# Patient Record
Sex: Male | Born: 1990 | Race: Black or African American | Hispanic: No | Marital: Single | Smoking: Current every day smoker
Health system: Southern US, Community
[De-identification: ages and names within clinical notes are randomized; demographics above are authoritative.]

---
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
## 2010-01-16 IMAGING — CR DG ELBOW 2V*L*
1 series · 2 of 2 positions shown · non-contrast
Comparison: none

REASON FOR EXAM: frall with pain
COMMENTS:

PROCEDURE:     DXR - DXR ELBOW LEFT AP AND LATERAL  - September 30, 2007  [DATE]
RESULT:     There is no evidence of fracture, dislocation or malalignment.
No evidence of anterior or posterior fat pad sign is appreciated.

[Series 1: view not recorded · 0.17mm/px · 2 of 2 slices shown]
[im 1/2]
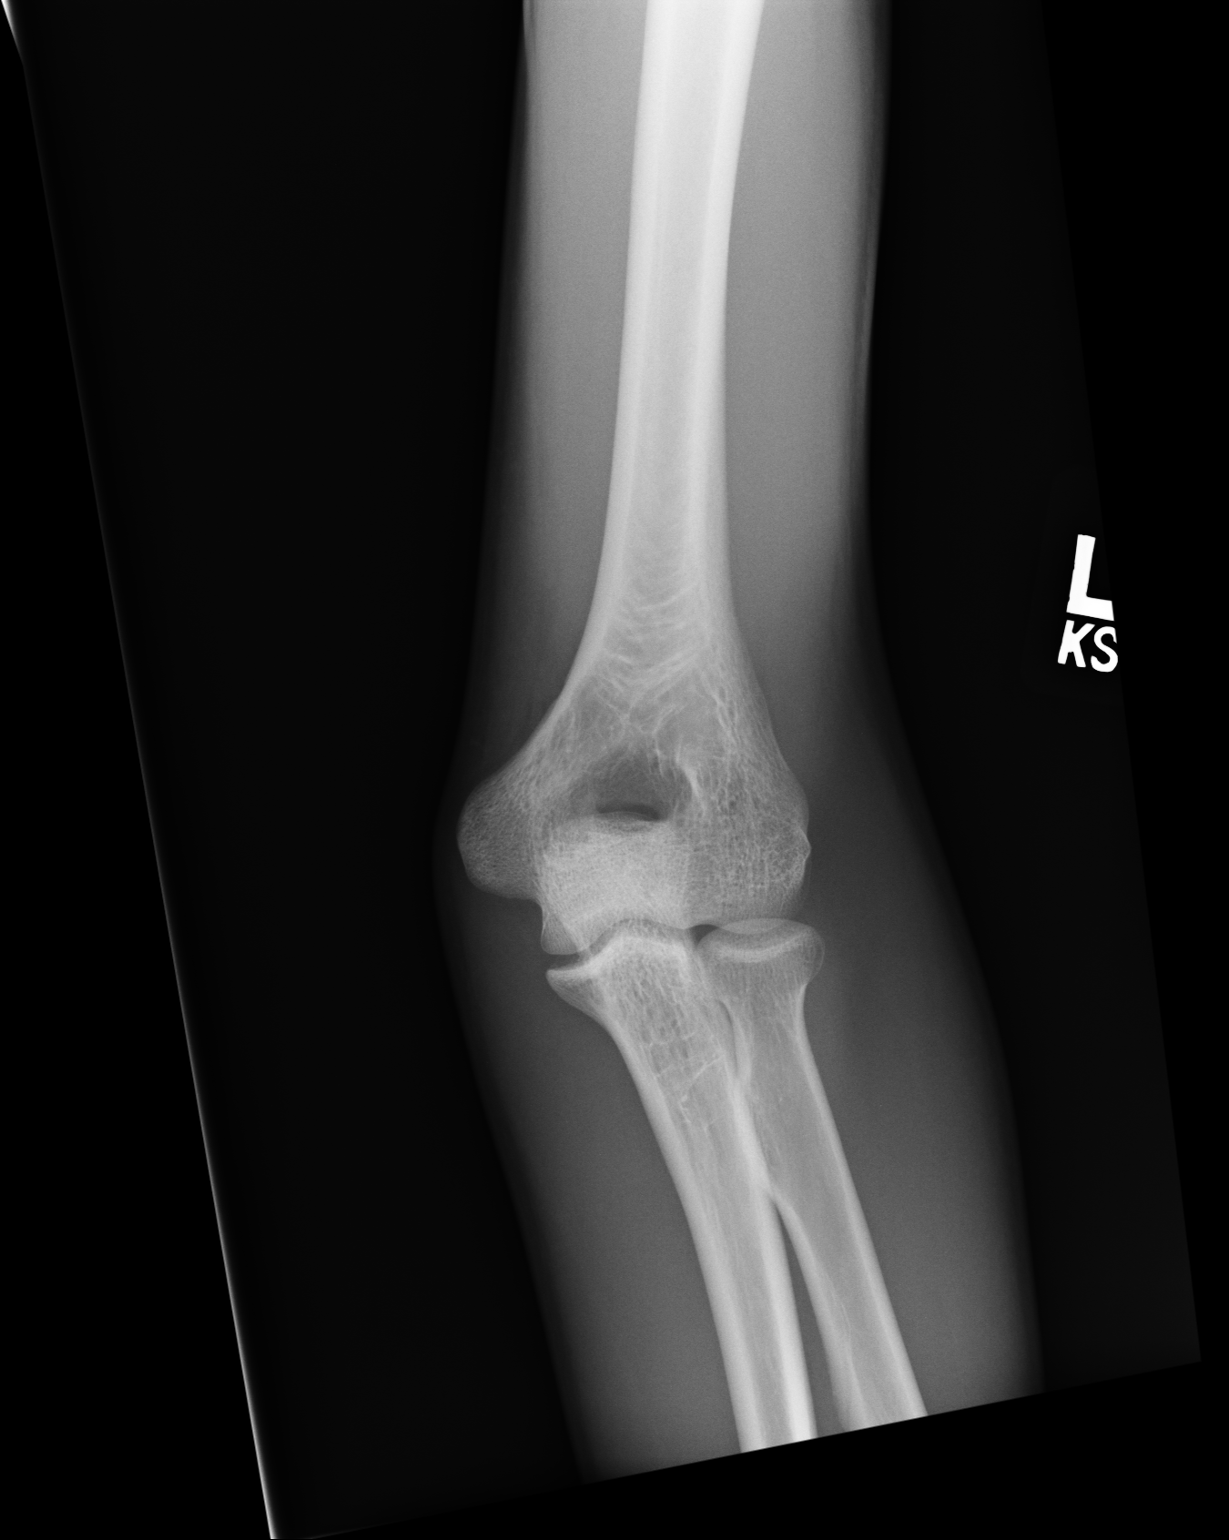
[im 2/2]
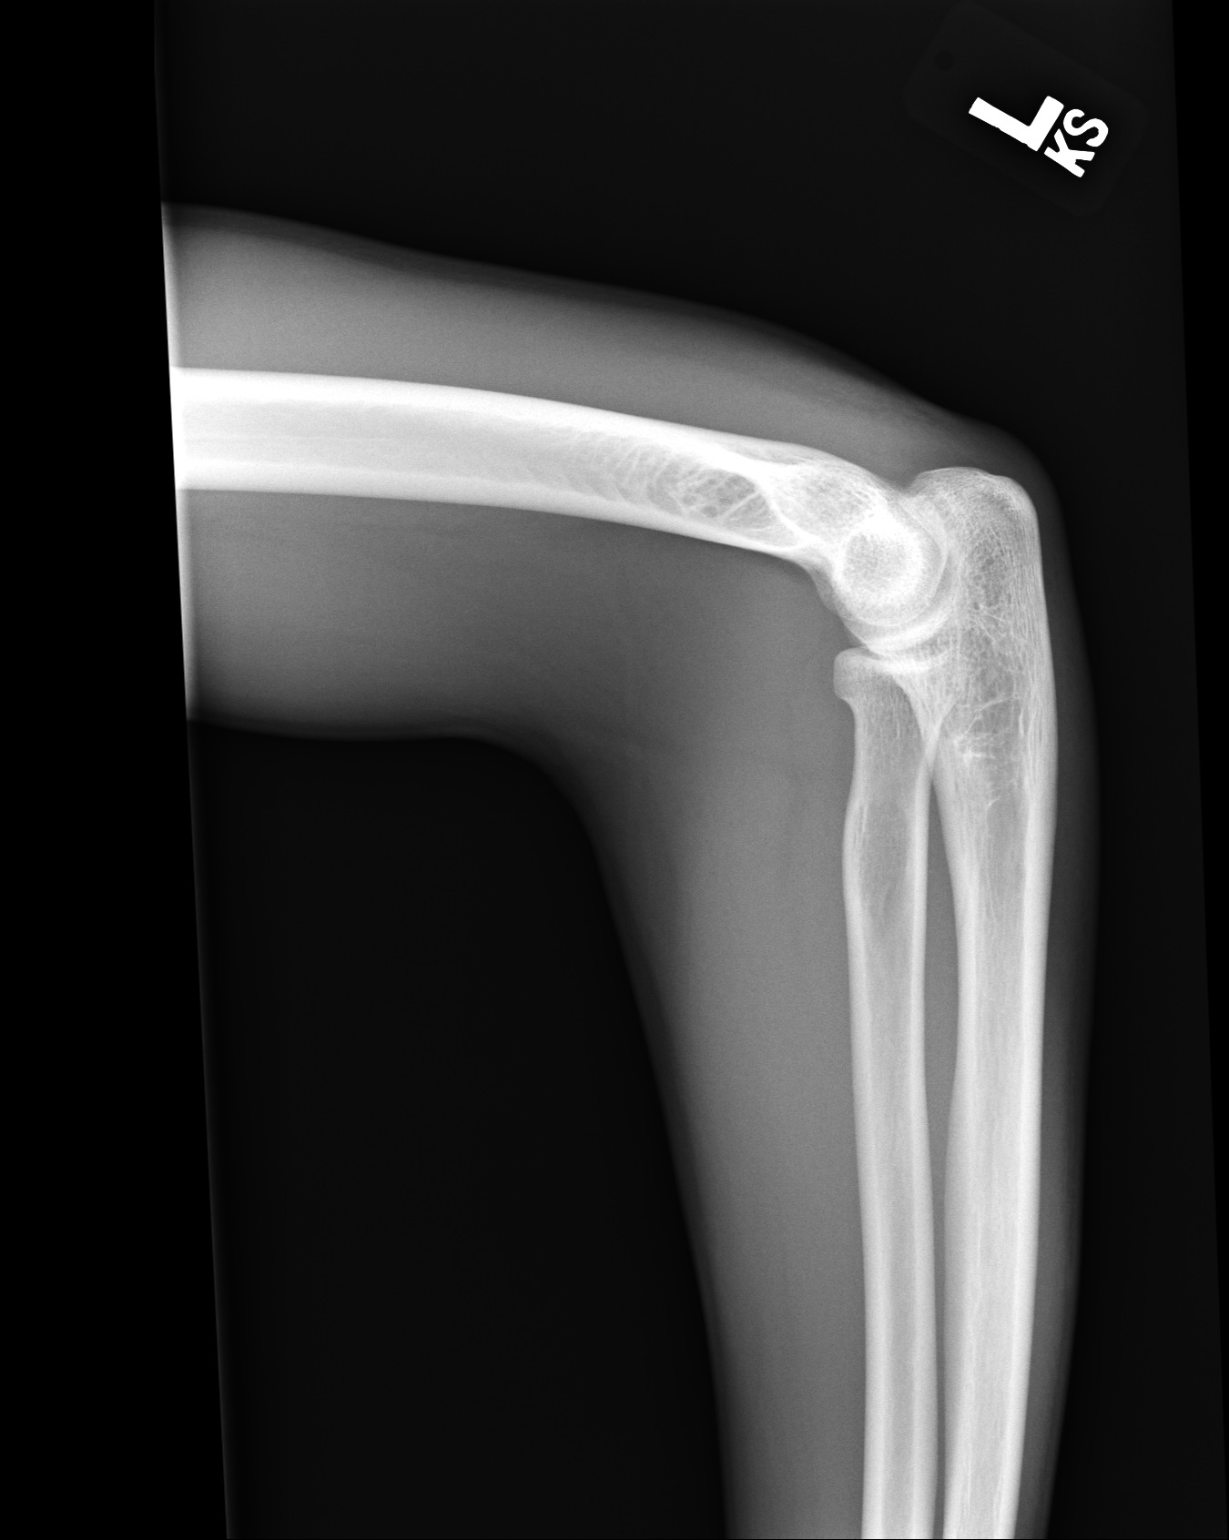

[2 of 2 positions shown; findings below may reference images not displayed]

IMPRESSION: Unremarkable LEFT elbow.

## 2015-02-09 ENCOUNTER — Encounter: Payer: Self-pay | Admitting: Emergency Medicine

## 2015-02-09 ENCOUNTER — Emergency Department
Admission: EM | Admit: 2015-02-09 | Discharge: 2015-02-09 | Disposition: A | Discharge status: Home / Self Care | Payer: Self-pay | Attending: Emergency Medicine | Admitting: Emergency Medicine

## 2015-02-09 DIAGNOSIS — R4581 Low self-esteem: Secondary | ICD-10-CM

## 2015-02-09 DIAGNOSIS — F329 Major depressive disorder, single episode, unspecified: Secondary | ICD-10-CM | POA: Insufficient documentation

## 2015-02-09 DIAGNOSIS — F32A Depression, unspecified: Secondary | ICD-10-CM

## 2015-02-09 DIAGNOSIS — F17200 Nicotine dependence, unspecified, uncomplicated: Secondary | ICD-10-CM | POA: Insufficient documentation

## 2015-02-09 DIAGNOSIS — F419 Anxiety disorder, unspecified: Secondary | ICD-10-CM | POA: Insufficient documentation

## 2015-02-09 NOTE — ED Provider Notes (Signed)
[facility] Emergency Department Provider Note  ____________________________________________  Time seen: 11:43 AM  I have reviewed the triage vital signs and the nursing notes.  History by:  Patient  HISTORY  Chief Complaint Depression     HPI Bain A Leite is a 24 y.o. male who came to the emergency department due to some feelings of depression. He reports that he had a good job in [place] making a fair amount of money, but the job was moved to [place]. He was unable to find a job is good. He returned to the [place] area to live with his parents. He is working currently but does not make as much money. He feels he is having concerns for self esteem and for his level of accomplishment. He feels like friends of his are doing better than he is. He tells me he feels he can't speak with his parents about these issues and he calm here today to speak with someone.  The patient denies any suicidal ideation. He denies any hallucinatory-type symptoms. He is alert, pleasant, and interactive, with a normal affect.    History reviewed. No pertinent past medical history.  There are no active problems to display for this patient.   History reviewed. No pertinent past surgical history.  No current outpatient prescriptions on file.  Allergies Review of patient's allergies indicates no known allergies.  No family history on file.  Social History Social History  Substance Use Topics  . Smoking status: Current Every Day Smoker  . Smokeless tobacco: None  . Alcohol Use: Yes     Comment: occassional    Review of Systems  Constitutional: Negative for fever/chills. ENT: Negative for congestion. Cardiovascular: Negative for chest pain. Respiratory: Negative for cough. Gastrointestinal: Negative for abdominal pain, vomiting and diarrhea. Genitourinary: Negative for dysuria. Musculoskeletal: No back pain. Skin: Negative for rash. Neurological: Negative for  headache or focal weakness Psychiatric: Some depression and anxiety. See history of present illness  10-point ROS otherwise negative.  ____________________________________________   PHYSICAL EXAM:  VITAL SIGNS: ED Triage Vitals  Enc Vitals Group     BP 02/09/15 1028 135/84 mmHg     Pulse Rate 02/09/15 1028 64     Resp 02/09/15 1028 18     Temp 02/09/15 1028 98.1 F (36.7 C)     Temp Source 02/09/15 1028 Oral     SpO2 02/09/15 1028 100 %     Weight 02/09/15 1028 140 lb (63.504 kg)     Height 02/09/15 1028  (1.753 m)     Head Cir --      Peak Flow --      Pain Score --      Pain Loc --      Pain Edu? --      Excl. in GC? --     Constitutional:  Alert and oriented. Well appearing and in no distress. ENT   Head: Normocephalic and atraumatic.   Nose: No congestion/rhinnorhea.  Cardiovascular: Normal rate, regular rhythm, no murmur noted Respiratory:  Normal respiratory effort, no tachypnea.    Breath sounds are clear and equal bilaterally.  Gastrointestinal: Soft, no distention. Nontender Back: No muscle spasm, no tenderness, no CVA tenderness. Musculoskeletal: No deformity noted. Nontender with normal range of motion in all extremities.  No noted edema. Neurologic:  Communicative. Normal appearing spontaneous movement in all 4 extremities. No gross focal neurologic deficits are appreciated.  Skin:  Skin is warm, dry. No rash noted. Psychiatric: Mood and affect  are normal. Speech and behavior are normal. Patient has intact cognition and appropriate behavior. He denies any suicidal ideation. He has a normal affect without any depressed affect. He expresses some reservation about being in the emergency department for his mild situational depression. ____________________________________________    LABS (pertinent positives/negatives)    ____________________________________________   EKG    ____________________________________________     RADIOLOGY    ____________________________________________   PROCEDURES    ____________________________________________   INITIAL IMPRESSION / ASSESSMENT AND PLAN / ED COURSE  Pertinent labs & imaging results that were available during my care of the patient were reviewed by me and considered in my medical decision making (see chart for details).  Very pleasant, interactive, normal-appearing, 24 year old male in no acute distress. He denies suicidal ideation. He is struggling with some issues regarding his self-esteem and feelings of accomplishment. He would like someone to speak with. He reports he came in today to do so and to seek other resources. There is no apparent urgent psychiatric condition. The patient agrees with this. I have spoke with him about following up at [facility]. He thinks this would be appropriate and agrees to this. We will discharge the patient without further consult.  ____________________________________________   FINAL CLINICAL IMPRESSION(S) / ED DIAGNOSES  Final diagnoses:  Depression  Low self esteem      Darien Ramusavid W Anmol Paschen, MD 02/09/15 1201

## 2015-02-09 NOTE — ED Notes (Signed)
Pt states he has been feeling depressed with some anxiety, denies any SI or HI at this time

## 2015-02-09 NOTE — Discharge Instructions (Signed)
[facility] for your low self-esteem issues and mild depression. Return to the emergency department if he have any worsening symptoms, thoughts of self-harm, suicidal thoughts, or thoughts of hurting others.

## 2015-02-09 NOTE — ED Notes (Signed)
Patient given information on [facility]: location, hours, and phone number.  Patient verbalized understanding of need to follow-up.  Patient also given crisis line phone number.  Patient denies any SI and HI.  Patient smiling and appropriate.  Patient is in no obvious distress or crisis at this time.

## 2015-02-09 NOTE — ED Notes (Signed)
MD at bedside. Dr. Carollee MassedKaminski in room to assess patient.  Will continue to monitor.
